# Patient Record
Sex: Female | Born: 1995 | Race: White | Hispanic: No | Marital: Single | Smoking: Never smoker
Health system: Southern US, Community
[De-identification: ages and names within clinical notes are randomized; demographics above are authoritative.]

## PROBLEM LIST (undated history)

## (undated) DIAGNOSIS — R112 Nausea with vomiting, unspecified: Secondary | ICD-10-CM

## (undated) DIAGNOSIS — Z789 Other specified health status: Secondary | ICD-10-CM

## (undated) HISTORY — PX: TONSILLECTOMY: SUR1361

## (undated) HISTORY — PX: COSMETIC SURGERY: SHX468

## (undated) HISTORY — PX: WISDOM TOOTH EXTRACTION: SHX21

---

## 2015-08-23 ENCOUNTER — Other Ambulatory Visit: Payer: Self-pay | Admitting: Family Medicine

## 2015-08-23 DIAGNOSIS — R102 Pelvic and perineal pain: Secondary | ICD-10-CM

## 2015-08-31 ENCOUNTER — Ambulatory Visit
Admission: RE | Admit: 2015-08-31 | Discharge: 2015-08-31 | Disposition: A | Discharge status: Home / Self Care | Payer: PRIVATE HEALTH INSURANCE | Source: Ambulatory Visit | Attending: Family Medicine | Admitting: Family Medicine

## 2015-08-31 DIAGNOSIS — R102 Pelvic and perineal pain: Secondary | ICD-10-CM

## 2016-07-05 ENCOUNTER — Ambulatory Visit (INDEPENDENT_AMBULATORY_CARE_PROVIDER_SITE_OTHER): Payer: PRIVATE HEALTH INSURANCE | Admitting: Emergency Medicine

## 2016-07-05 VITALS — BP 108/68 | HR 82 | Temp 97.6°F | Resp 17 | Ht 67.0 in | Wt 135.0 lb

## 2016-07-05 DIAGNOSIS — Z3201 Encounter for pregnancy test, result positive: Secondary | ICD-10-CM

## 2016-07-05 DIAGNOSIS — R112 Nausea with vomiting, unspecified: Secondary | ICD-10-CM | POA: Diagnosis not present

## 2016-07-05 DIAGNOSIS — R197 Diarrhea, unspecified: Secondary | ICD-10-CM | POA: Diagnosis not present

## 2016-07-05 DIAGNOSIS — R1084 Generalized abdominal pain: Secondary | ICD-10-CM | POA: Diagnosis not present

## 2016-07-05 LAB — POC MICROSCOPIC URINALYSIS (UMFC): Mucus: ABSENT

## 2016-07-05 LAB — POCT URINALYSIS DIP (MANUAL ENTRY)
BILIRUBIN UA: NEGATIVE
BILIRUBIN UA: NEGATIVE
Glucose, UA: NEGATIVE
NITRITE UA: NEGATIVE
PH UA: 7.5
PROTEIN UA: NEGATIVE
RBC UA: NEGATIVE
Spec Grav, UA: 1.015
Urobilinogen, UA: 1

## 2016-07-05 LAB — POCT URINE PREGNANCY: Preg Test, Ur: POSITIVE — AB

## 2016-07-05 MED ORDER — ONDANSETRON 4 MG PO TBDP: 4.0000 mg | ORAL_TABLET | Freq: Three times a day (TID) | ORAL | 0 refills | Status: DC | PRN

## 2016-07-05 MED ORDER — DICYCLOMINE HCL 20 MG PO TABS: 20.0000 mg | ORAL_TABLET | Freq: Four times a day (QID) | ORAL | 1 refills | Status: DC

## 2016-07-05 NOTE — Patient Instructions (Addendum)
IF you received an x-ray today, you will receive an invoice from [facility]. Please contact [facility] at [phone] with questions or concerns regarding your invoice.   IF you received labwork today, you will receive an invoice from [facility]. Please contact [facility] at [phone] with questions or concerns regarding your invoice.   Our billing staff will not be able to assist you with questions regarding bills from these companies.  You will be contacted with the lab results as soon as they are available. The fastest way to get your results is to activate your My Chart account. Instructions are located on the last page of this paperwork. If you have not heard from us regarding the results in 2 weeks, please contact this office.      Abdominal Pain, Adult Many things can cause belly (abdominal) pain. Most times, belly pain is not dangerous. Many cases of belly pain can be watched and treated at home. Sometimes belly pain is serious, though. Your doctor will try to find the cause of your belly pain. Follow these instructions at home:  Take over-the-counter and prescription medicines only as told by your doctor. Do not take medicines that help you poop (laxatives) unless told to by your doctor.  Drink enough fluid to keep your pee (urine) clear or pale yellow.  Watch your belly pain for any changes.  Keep all follow-up visits as told by your doctor. This is important. Contact a doctor if:  Your belly pain changes or gets worse.  You are not hungry, or you lose weight without trying.  You are having trouble pooping (constipated) or have watery poop (diarrhea) for more than 2-3 days.  You have pain when you pee or poop.  Your belly pain wakes you up at night.  Your pain gets worse with meals, after eating, or with certain foods.  You are throwing up and cannot keep anything down.  You have a fever. Get help right away if:  Your pain does not go  away as soon as your doctor says it should.  You cannot stop throwing up.  Your pain is only in areas of your belly, such as the right side or the left lower part of the belly.  You have bloody or black poop, or poop that looks like tar.  You have very bad pain, cramping, or bloating in your belly.  You have signs of not having enough fluid or water in your body (dehydration), such as:  Dark pee, very little pee, or no pee.  Cracked lips.  Dry mouth.  Sunken eyes.  Sleepiness.  Weakness. This information is not intended to replace advice given to you by your health care provider. Make sure you discuss any questions you have with your health care provider. Document Released: 12/17/2007 Document Revised: 01/18/2016 Document Reviewed: 12/12/2015 Elsevier Interactive Patient Education  2017 Elsevier Inc.  Abdominal Pain During Pregnancy Belly (abdominal) pain is common during pregnancy. Most of the time, it is not a serious problem. Other times, it can be a sign that something is wrong with the pregnancy. Always tell your doctor if you have belly pain. Follow these instructions at home: Monitor your belly pain for any changes. The following actions may help you feel better:  Do not have sex (intercourse) or put anything in your vagina until you feel better.  Rest until your pain stops.  Drink clear fluids if you feel sick to your stomach (nauseous). Do not eat solid food until  you feel better.  Only take medicine as told by your doctor.  Keep all doctor visits as told. Get help right away if:  You are bleeding, leaking fluid, or pieces of tissue come out of your vagina.  You have more pain or cramping.  You keep throwing up (vomiting).  You have pain when you pee (urinate) or have blood in your pee.  You have a fever.  You do not feel your baby moving as much.  You feel very weak or feel like passing out.  You have trouble breathing, with or without belly  pain.  You have a very bad headache and belly pain.  You have fluid leaking from your vagina and belly pain.  You keep having watery poop (diarrhea).  Your belly pain does not go away after resting, or the pain gets worse. This information is not intended to replace advice given to you by your health care provider. Make sure you discuss any questions you have with your health care provider. Document Released: 06/18/2009 Document Revised: 02/06/2016 Document Reviewed: 01/27/2013 Elsevier Interactive Patient Education  2017 [employer].

## 2016-07-05 NOTE — Progress Notes (Addendum)
Kathryn Swanson 20 y.o.   Chief Complaint  Patient presents with  . Abdominal Pain    Nausea, vomiting and diarrhea x1week. Pt states "comes in waves"    HISTORY OF PRESENT ILLNESS: This is a 20 y.o. female complaining of 2 week h/o intermittent lower abdominal cramping with diarrhea, developed n/v today, denies vaginal bleeding, on oral contraceptives but with erratic dosing schedule. Denies pregnancy. In no distress and having no pain now. Had normal period in November.  Abdominal Pain  This is a chronic problem. The current episode started 1 to 4 weeks ago. The onset quality is sudden. The problem occurs intermittently. Duration: several minutes. The problem has been waxing and waning. The pain is located in the generalized abdominal region (mostly lower). The pain is at a severity of 7/10. The patient is experiencing no pain (now). The quality of the pain is colicky, aching and sharp. The abdominal pain does not radiate. Associated symptoms include anorexia, constipation, diarrhea, nausea and vomiting (today). Pertinent negatives include no arthralgias, dysuria, frequency, headaches, hematochezia, hematuria, melena or myalgias. The pain is aggravated by eating. Relieved by: spontaneous relief. She has tried nothing for the symptoms. The treatment provided no relief. no medical Hx     Prior to Admission medications   Medication Sig Start Date End Date Taking? Authorizing Provider  drospirenone-ethinyl estradiol (YAZ,GIANVI,LORYNA) 3-0.02 MG tablet Take 1 tablet by mouth daily.   Yes Historical Provider, MD  sertraline (ZOLOFT) 25 MG tablet Take 25 mg by mouth daily.   Yes Historical Provider, MD    Allergies  Allergen Reactions  . Codeine     There are no active problems to display for this patient.   No past medical history on file.  No past surgical history on file.  Social History   Social History  . Marital status: Single    Spouse name: N/A  . Number of children: N/A   . Years of education: N/A   Occupational History  . Not on file.   Social History Main Topics  . Smoking status: Never Smoker  . Smokeless tobacco: Not on file  . Alcohol use Not on file  . Drug use: Unknown  . Sexual activity: Not on file   Other Topics Concern  . Not on file   Social History Narrative  . No narrative on file    No family history on file.   Review of Systems  Constitutional: Negative.  Negative for chills.  HENT: Negative.   Eyes: Negative.   Respiratory: Negative.  Negative for cough and shortness of breath.   Cardiovascular: Negative.  Negative for chest pain and palpitations.  Gastrointestinal: Positive for abdominal pain, anorexia, constipation, diarrhea, nausea and vomiting (today). Negative for hematochezia and melena.  Genitourinary: Negative.  Negative for dysuria, frequency and hematuria.  Musculoskeletal: Negative.  Negative for arthralgias, back pain and myalgias.  Skin: Negative.  Negative for rash.  Neurological: Negative.  Negative for headaches.  Endo/Heme/Allergies: Negative.   Psychiatric/Behavioral: Negative.      Physical Exam  Constitutional: She is oriented to person, place, and time. She appears well-developed and well-nourished.  HENT:  Head: Normocephalic and atraumatic.  Eyes: Conjunctivae and EOM are normal. Pupils are equal, round, and reactive to light.  Neck: Normal range of motion. Neck supple.  Cardiovascular: Normal rate, regular rhythm, normal heart sounds and intact distal pulses.   Pulmonary/Chest: Effort normal and breath sounds normal.  Abdominal: Soft. Bowel sounds are normal. She exhibits no mass. There is  no tenderness. There is no rebound.  Musculoskeletal: Normal range of motion.  Neurological: She is alert and oriented to person, place, and time.  Skin: Skin is warm and dry.  Psychiatric: She has a normal mood and affect.     ASSESSMENT & PLAN: Toni AmendCourtney was seen today for abdominal pain.  Diagnoses  and all orders for this visit:  Generalized abdominal pain -     Comprehensive metabolic panel -     CBC with Differential/Platelet -     POCT urinalysis dipstick -     POCT Microscopic Urinalysis ([facility]) -     POCT urine pregnancy -     Ambulatory referral to Gastroenterology -     Care order/instruction: -     Beta HCG, Quant  Non-intractable vomiting with nausea, unspecified vomiting type  Diarrhea, unspecified type  Positive pregnancy test  Other orders -     Discontinue: dicyclomine (BENTYL) 20 MG tablet; Take 1 tablet (20 mg total) by mouth every 6 (six) hours. As needed for abdominal pain. -     ondansetron (ZOFRAN ODT) 4 MG disintegrating tablet; Take 1 tablet (4 mg total) by mouth every 8 (eight) hours as needed for nausea or vomiting.    DDx d/w patient and mother in the room. Aware of possibilities including miscarriage/ectopic pregnancy. Advised to go to ER today or if worse; o/w needs to follow up with GYNMD.  07/06/16 1030am: Called pt's phone number on record but no answer. Called emergency contact number and spoke to patient's mother, Marcelino DusterMichelle, who was present in the room at all times yesterday and is very much aware of Delorese's medical condition/issues. She says patient is doing better today, however I made her aware that the blood pregnancy test confirmed the urinary result from yesterday and that an ectopic pregnancy is a possibility and needs to be ruled out. Advised to go to ER today and have it ruled out. She understands the nature of the problem and will follow the advise as recommended.  07/12/2016 1630: [facility]ried [employer] but no answer. Spoke to mother, Marcelino DusterMichelle. Pt doing well; went to the Hospital and ectopic was ruled out; 7.5 week IUP. Doing well.  Edwina BarthMiguel Johnaton Sonneborn, MD [facility]

## 2016-07-06 ENCOUNTER — Inpatient Hospital Stay
Admit: 2016-07-06 | Discharge: 2016-07-06 | Disposition: A | Discharge status: Home / Self Care | Payer: PRIVATE HEALTH INSURANCE | Attending: Emergency Medicine

## 2016-07-06 ENCOUNTER — Emergency Department: Admit: 2016-07-06 | Payer: PRIVATE HEALTH INSURANCE | Primary: Family Medicine

## 2016-07-06 ENCOUNTER — Emergency Department

## 2016-07-06 DIAGNOSIS — O26891 Other specified pregnancy related conditions, first trimester: Secondary | ICD-10-CM

## 2016-07-06 LAB — CBC WITH DIFFERENTIAL/PLATELET
BASOS ABS: 0 10*3/uL (ref 0.0–0.2)
Basos: 0 %
EOS (ABSOLUTE): 0.1 10*3/uL (ref 0.0–0.4)
Eos: 1 %
Hematocrit: 37.8 % (ref 34.0–46.6)
Hemoglobin: 12.9 g/dL (ref 11.1–15.9)
IMMATURE GRANS (ABS): 0 10*3/uL (ref 0.0–0.1)
IMMATURE GRANULOCYTES: 0 %
LYMPHS: 14 %
Lymphocytes Absolute: 1.3 10*3/uL (ref 0.7–3.1)
MCH: 30.1 pg (ref 26.6–33.0)
MCHC: 34.1 g/dL (ref 31.5–35.7)
MCV: 88 fL (ref 79–97)
Monocytes Absolute: 0.5 10*3/uL (ref 0.1–0.9)
Monocytes: 6 %
NEUTROS PCT: 79 %
Neutrophils Absolute: 7.1 10*3/uL — ABNORMAL HIGH (ref 1.4–7.0)
PLATELETS: 230 10*3/uL (ref 150–379)
RBC: 4.28 x10E6/uL (ref 3.77–5.28)
RDW: 13.1 % (ref 12.3–15.4)
WBC: 9 10*3/uL (ref 3.4–10.8)

## 2016-07-06 LAB — COMPREHENSIVE METABOLIC PANEL
A/G RATIO: 1.8 (ref 1.2–2.2)
ALT: 26 IU/L (ref 0–32)
AST: 36 IU/L (ref 0–40)
Albumin: 4 g/dL (ref 3.5–5.5)
Alkaline Phosphatase: 46 IU/L (ref 39–117)
BUN/Creatinine Ratio: 13 (ref 9–23)
BUN: 8 mg/dL (ref 6–20)
Bilirubin Total: 0.3 mg/dL (ref 0.0–1.2)
CALCIUM: 9.2 mg/dL (ref 8.7–10.2)
CO2: 22 mmol/L (ref 18–29)
Chloride: 101 mmol/L (ref 96–106)
Creatinine, Ser: 0.61 mg/dL (ref 0.57–1.00)
GFR calc Af Amer: 151 mL/min/{1.73_m2} (ref >59)
GFR, EST NON AFRICAN AMERICAN: 131 mL/min/{1.73_m2} (ref >59)
Globulin, Total: 2.2 g/dL (ref 1.5–4.5)
Glucose: 80 mg/dL (ref 65–99)
POTASSIUM: 4.2 mmol/L (ref 3.5–5.2)
Sodium: 139 mmol/L (ref 134–144)
Total Protein: 6.2 g/dL (ref 6.0–8.5)

## 2016-07-06 LAB — BETA HCG, QT
Beta HCG, QT: 59213 m[IU]/mL — ABNORMAL HIGH (ref 0–6)
hCG Quant: 59213 m[IU]/mL — ABNORMAL HIGH (ref 0–6)

## 2016-07-06 LAB — CBC WITH AUTOMATED DIFF
ABS. BASOPHILS: 0 10*3/uL (ref 0.0–0.1)
ABS. EOSINOPHILS: 0.1 10*3/uL (ref 0.0–0.4)
ABS. LYMPHOCYTES: 2.1 10*3/uL (ref 0.8–3.5)
ABS. MONOCYTES: 0.5 10*3/uL (ref 0.0–1.0)
ABS. NEUTROPHILS: 7 10*3/uL (ref 1.8–8.0)
BASOPHILS: 0 % (ref 0–1)
EOSINOPHILS: 1 % (ref 0–7)
HCT: 40.6 % (ref 35.0–47.0)
HGB: 13.7 g/dL (ref 11.5–16.0)
LYMPHOCYTES: 21 % (ref 12–49)
MCH: 29.9 PG (ref 26.0–34.0)
MCHC: 33.7 g/dL (ref 30.0–36.5)
MCV: 88.6 FL (ref 80.0–99.0)
MONOCYTES: 5 % (ref 5–13)
NEUTROPHILS: 73 % (ref 32–75)
PLATELET: 212 10*3/uL (ref 150–400)
RBC: 4.58 M/uL (ref 3.80–5.20)
RDW: 12.8 % (ref 11.5–14.5)
WBC: 9.7 10*3/uL (ref 3.6–11.0)

## 2016-07-06 LAB — METABOLIC PANEL, COMPREHENSIVE
A-G Ratio: 1.1 (ref 1.1–2.2)
ALT (SGPT): 31 U/L (ref 12–78)
AST (SGOT): 16 U/L (ref 15–37)
Albumin: 3.7 g/dL (ref 3.5–5.0)
Alk. phosphatase: 49 U/L (ref 45–117)
Anion gap: 9 mmol/L (ref 5–15)
BUN/Creatinine ratio: 12 (ref 12–20)
BUN: 8 MG/DL (ref 6–20)
Bilirubin, total: 0.3 MG/DL (ref 0.2–1.0)
CO2: 26 mmol/L (ref 21–32)
Calcium: 8.8 MG/DL (ref 8.5–10.1)
Chloride: 104 mmol/L (ref 97–108)
Creatinine: 0.68 MG/DL (ref 0.55–1.02)
GFR est AA: >60 mL/min/{1.73_m2} (ref >60)
GFR est non-AA: >60 mL/min/{1.73_m2} (ref >60)
Globulin: 3.4 g/dL (ref 2.0–4.0)
Glucose: 81 mg/dL (ref 65–100)
Potassium: 3.5 mmol/L (ref 3.5–5.1)
Protein, total: 7.1 g/dL (ref 6.4–8.2)
Sodium: 139 mmol/L (ref 136–145)

## 2016-07-06 LAB — URINALYSIS W/MICROSCOPIC
Bacteria: NEGATIVE /hpf
Bilirubin: NEGATIVE
Blood: NEGATIVE
Glucose: NEGATIVE mg/dL
Ketone: NEGATIVE mg/dL
Leukocyte Esterase: NEGATIVE
Nitrites: NEGATIVE
Protein: NEGATIVE mg/dL
Specific gravity: 1.021 (ref 1.003–1.030)
Urobilinogen: 0.2 EU/dL (ref 0.2–1.0)
pH (UA): 6.5 (ref 5.0–8.0)

## 2016-07-06 LAB — WET PREP
Clue cells: ABSENT
Wet prep: NONE SEEN

## 2016-07-06 LAB — KOH, OTHER SOURCES: KOH: NONE SEEN

## 2016-07-06 LAB — LIPASE: Lipase: 156 U/L (ref 73–393)

## 2016-07-06 MED ORDER — ONDANSETRON (PF) 4 MG/2 ML INJECTION
4 mg/2 mL | INTRAMUSCULAR | Status: AC
Start: 2016-07-06 — End: 2016-07-06
  Administered 2016-07-06: 22:00:00 via INTRAVENOUS

## 2016-07-06 MED ORDER — SODIUM CHLORIDE 0.9% BOLUS IV
0.9 % | Freq: Once | INTRAVENOUS | Status: AC
Start: 2016-07-06 — End: 2016-07-06
  Administered 2016-07-06: 22:00:00 via INTRAVENOUS

## 2016-07-06 MED ORDER — ONDANSETRON 4 MG TAB, RAPID DISSOLVE
4 mg | ORAL_TABLET | Freq: Three times a day (TID) | ORAL | 0 refills | Status: AC | PRN
Start: 2016-07-06 — End: ?

## 2016-07-06 MED FILL — ONDANSETRON (PF) 4 MG/2 ML INJECTION: 4 mg/2 mL | INTRAMUSCULAR | Qty: 2

## 2016-07-06 MED FILL — SODIUM CHLORIDE 0.9 % IV: INTRAVENOUS | Qty: 1000

## 2016-07-06 NOTE — ED Notes (Signed)
Pt to US via wheelchair.

## 2016-07-06 NOTE — ED Notes (Signed)
Pt discharged by provider.

## 2016-07-06 NOTE — ED Triage Notes (Signed)
Triage Note: Patient is coming in with abdominal pain with vomiting and diarrhea. Patient was seen at [facility] yesterday. Patient tested positive for pregnancy yesterday. Patient is having pain in waves and is concerned for ectrotic pregnancy.  Patient is from out of town.

## 2016-07-06 NOTE — ED Provider Notes (Signed)
HPI Comments: 20 y.o. female with no significant past medical history who presents from home via private vehicle with chief complaint of abdominal pain, nausea, vomiting, diarrhea.  Pt reports that her Sx started a few days ago and have persisted since onset without relief.  She was seen at an urgent care clinic yesterday for her Sx and during her work up had a positive pregnancy test.  She was D/C home after U/A negative for infection.  However, today her abdominal pain worsened and has been intermittent cramping pains which concerned and her prompted her to come to the ED for further evaluation.  Pt denies fever, chills, vaginal bleeding or discharge.  There are no other acute medical concerns at this time.    Note written by Rondall AllegraMallory Abney, scribe, as dictated by Pierre BaliAlexis A DiMaio, MD 6:05 PM        The history is provided by the patient.        History reviewed. No pertinent past medical history.    Past Surgical History:   Procedure Laterality Date   ??? HX HEENT      Tonsillectomy   ??? HX OTHER SURGICAL      Cosmetic Surgery         History reviewed. No pertinent family history.    Social History     Social History   ??? Marital status: SINGLE     Spouse name: N/A   ??? Number of children: N/A   ??? Years of education: N/A     Occupational History   ??? Not on file.     Social History Main Topics   ??? Smoking status: Never Smoker   ??? Smokeless tobacco: Never Used   ??? Alcohol use No   ??? Drug use: No   ??? Sexual activity: Not on file     Other Topics Concern   ??? Not on file     Social History Narrative   ??? No narrative on file         ALLERGIES: Codeine    Review of Systems   Constitutional: Negative for chills and fever.   HENT: Negative for congestion, rhinorrhea, sneezing and sore throat.    Eyes: Negative for redness and visual disturbance.   Respiratory: Negative for shortness of breath.    Cardiovascular: Negative for chest pain and leg swelling.   Gastrointestinal: Positive for abdominal pain, diarrhea, nausea and  vomiting.   Genitourinary: Negative for difficulty urinating, dysuria, frequency, vaginal bleeding and vaginal discharge.   Musculoskeletal: Negative for back pain, myalgias and neck stiffness.   Skin: Negative for rash.   Neurological: Negative for dizziness, syncope, weakness and headaches.   Hematological: Negative for adenopathy.   All other systems reviewed and are negative.      Vitals:    07/06/16 1457   BP: 114/77   Pulse: 85   Resp: 14   Temp: 98.2 ??F (36.8 ??C)   SpO2: 99%   Weight: 61.5 kg (135 lb 8 oz)   Height: 5\' 7"  (1.702 m)            Physical Exam   Constitutional: She is oriented to person, place, and time. She appears well-developed and well-nourished.   HENT:   Head: Normocephalic and atraumatic.   Eyes: Conjunctivae are normal. Pupils are equal, round, and reactive to light. Right eye exhibits no discharge. Left eye exhibits no discharge.   Neck: Normal range of motion. Neck supple. No thyromegaly present.   Cardiovascular: Normal rate,  regular rhythm and normal heart sounds.  Exam reveals no gallop and no friction rub.    No murmur heard.  Pulmonary/Chest: Effort normal and breath sounds normal. No respiratory distress. She has no wheezes.   Abdominal: Soft. Bowel sounds are normal. She exhibits no distension. There is no tenderness. There is no rebound and no guarding.   Musculoskeletal: Normal range of motion.   Neurological: She is alert and oriented to person, place, and time.   Skin: Skin is warm.   Psychiatric: She has a normal mood and affect.        MDM  Number of Diagnoses or Management Options  Nausea and vomiting, intractability of vomiting not specified, unspecified vomiting type:   Diagnosis management comments: US revealed viable intrauterine pregnancy.  Pt is afebrile and non toxic appearing.  Will treat nausea with zofran and pt was advised to follow up with her family doctor for further evaluation of symptoms.  Reviewed treatment plan with attending and they agree.   Eliezer Champagneavid B Marquist Binstock, PA-C    ED Course       Procedures

## 2016-07-07 LAB — BLOOD TYPE, (ABO+RH)
ABO/Rh(D): A NEG
ABO/Rh: A NEG
WEAK D: NEGATIVE
Weak D: NEGATIVE

## 2016-07-08 LAB — CHLAMYDIA/GC PCR
Chlamydia amplified: NEGATIVE
N. gonorrhea, amplified: NEGATIVE

## 2016-07-09 LAB — BETA HCG QUANT (REF LAB): HCG QUANT: 52883 m[IU]/mL

## 2016-07-09 LAB — SPECIMEN STATUS REPORT

## 2016-07-12 ENCOUNTER — Telehealth: Payer: Self-pay | Admitting: Emergency Medicine

## 2016-07-14 NOTE — L&D Delivery Note (Addendum)
Delivery Note At 5:54 AM a viable female was delivered via Vaginal, Spontaneous Delivery (Presentation: vtx; ROA ).  APGAR: 6, 9; weight pending.   Placenta status: spontaneous, intact.  Cord:  with the following complications: nuchal x 2 reduced.   Anesthesia:  Epidural, local Episiotomy: Median-done due to maternal exhaustion Lacerations:  None Suture Repair: 3.0 vicryl rapide Est. Blood Loss (mL):  200  Mom to postpartum.  Baby to Couplet care / Skin to Skin.  Will do circumcision in the hospital.  She was started on Unasyn at 0200 for temp to 101.3, will give one more dose then d/c.  Leighton Roach Natania Finigan 02/28/2017, 6:17 AM

## 2016-07-29 LAB — OB RESULTS CONSOLE GC/CHLAMYDIA
Chlamydia: NEGATIVE
Gonorrhea: NEGATIVE

## 2016-07-29 LAB — OB RESULTS CONSOLE HEPATITIS B SURFACE ANTIGEN: HEP B S AG: NEGATIVE

## 2016-07-29 LAB — OB RESULTS CONSOLE RUBELLA ANTIBODY, IGM: Rubella: IMMUNE

## 2016-07-29 LAB — OB RESULTS CONSOLE ABO/RH: RH Type: NEGATIVE

## 2016-07-29 LAB — OB RESULTS CONSOLE HIV ANTIBODY (ROUTINE TESTING): HIV: NONREACTIVE

## 2016-07-29 LAB — OB RESULTS CONSOLE ANTIBODY SCREEN: Antibody Screen: NEGATIVE

## 2016-07-29 LAB — OB RESULTS CONSOLE RPR: RPR: NONREACTIVE

## 2016-10-24 ENCOUNTER — Ambulatory Visit: Payer: PRIVATE HEALTH INSURANCE | Admitting: Podiatry

## 2016-12-05 NOTE — Telephone Encounter (Signed)
Spoke to mother about results and need to get ultrasound to r/o ectopic; pt had u/s done and showed live IUP; she's doing better.

## 2017-01-30 LAB — OB RESULTS CONSOLE GBS: STREP GROUP B AG: POSITIVE

## 2017-02-13 ENCOUNTER — Telehealth (HOSPITAL_COMMUNITY): Payer: Self-pay | Admitting: *Deleted

## 2017-02-13 ENCOUNTER — Encounter (HOSPITAL_COMMUNITY): Payer: Self-pay | Admitting: *Deleted

## 2017-02-13 NOTE — Telephone Encounter (Signed)
Preadmission screen  

## 2017-02-21 ENCOUNTER — Inpatient Hospital Stay (HOSPITAL_COMMUNITY)
Admission: AD | Admit: 2017-02-21 | Payer: PRIVATE HEALTH INSURANCE | Source: Ambulatory Visit | Admitting: Obstetrics and Gynecology

## 2017-02-26 NOTE — H&P (Signed)
Kathryn Swanson is a 21 y.o. female, G1P0, EGA 40+ weeks with EDC 8-11 presenting for cervical ripening and induction.  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  OB History    Gravida Para Term Preterm AB Living   1             SAB TAB Ectopic Multiple Live Births                 No past medical history on file. No past surgical history on file. Family History: family history is not on file. Social History:  reports that she has never smoked. She does not have any smokeless tobacco history on file. Her alcohol and drug histories are not on file.     Maternal Diabetes: No Genetic Screening: Normal Maternal Ultrasounds/Referrals: Abnormal:  Findings:   Isolated choroid plexus cyst, Other: Fetal Ultrasounds or other Referrals:  None Maternal Substance Abuse:  No Significant Maternal Medications:  None Significant Maternal Lab Results:  Lab values include: Group B Strep positive Other Comments:  2 vessel cord  Review of Systems  Respiratory: Negative.   Cardiovascular: Negative.    Maternal Medical History:  Fetal activity: Perceived fetal activity is normal.    Prenatal complications: no prenatal complications     Last menstrual period 05/17/2016. Maternal Exam:  Abdomen: Patient reports no abdominal tenderness. Estimated fetal weight is 7 lbs.   Fetal presentation: vertex  Introitus: Normal vulva. Normal vagina.  Amniotic fluid character: not assessed.  Pelvis: adequate for delivery.      Physical Exam  Constitutional: She appears well-developed and well-nourished.  Cardiovascular: Normal rate and regular rhythm.   Respiratory: Effort normal. No respiratory distress.  GI: Soft.    Prenatal labs: ABO, Rh: A/Negative/-- (01/16 0000) Antibody: Negative (01/16 0000) Rubella: Immune (01/16 0000) RPR: Nonreactive (01/16 0000)  HBsAg: Negative (01/16 0000)  HIV: Non-reactive (01/16  0000)  GBS: Positive (07/20 0000)   Assessment/Plan: IUP at 40+ weeks with unfavorable cervix, 2 VC and GBS +.  Will admit for cervical ripening with cytotec, PCN for GBS to start on admission also.     Leighton Roachodd D Denis Koppel 02/26/2017, 8:15 PM

## 2017-02-27 ENCOUNTER — Inpatient Hospital Stay (HOSPITAL_COMMUNITY): Payer: PRIVATE HEALTH INSURANCE | Admitting: Anesthesiology

## 2017-02-27 ENCOUNTER — Encounter (HOSPITAL_COMMUNITY): Payer: Self-pay

## 2017-02-27 ENCOUNTER — Inpatient Hospital Stay (HOSPITAL_COMMUNITY)
Admission: RE | Admit: 2017-02-27 | Discharge: 2017-03-02 | DRG: 775 | Disposition: A | Discharge status: Home / Self Care | Payer: PRIVATE HEALTH INSURANCE | Source: Ambulatory Visit | Attending: Obstetrics and Gynecology | Admitting: Obstetrics and Gynecology

## 2017-02-27 DIAGNOSIS — O99824 Streptococcus B carrier state complicating childbirth: Secondary | ICD-10-CM | POA: Diagnosis present

## 2017-02-27 DIAGNOSIS — Z3A40 40 weeks gestation of pregnancy: Secondary | ICD-10-CM

## 2017-02-27 DIAGNOSIS — O358XX0 Maternal care for other (suspected) fetal abnormality and damage, not applicable or unspecified: Secondary | ICD-10-CM | POA: Diagnosis present

## 2017-02-27 DIAGNOSIS — O26893 Other specified pregnancy related conditions, third trimester: Secondary | ICD-10-CM | POA: Diagnosis present

## 2017-02-27 HISTORY — DX: Other specified health status: Z78.9

## 2017-02-27 LAB — TYPE AND SCREEN
ABO/RH(D): A NEG
Antibody Screen: NEGATIVE

## 2017-02-27 LAB — CBC
HEMATOCRIT: 31.9 % — AB (ref 36.0–46.0)
Hemoglobin: 10.7 g/dL — ABNORMAL LOW (ref 12.0–15.0)
MCH: 26.4 pg (ref 26.0–34.0)
MCHC: 33.5 g/dL (ref 30.0–36.0)
MCV: 78.8 fL (ref 78.0–100.0)
Platelets: 203 10*3/uL (ref 150–400)
RBC: 4.05 MIL/uL (ref 3.87–5.11)
RDW: 15.3 % (ref 11.5–15.5)
WBC: 9.9 10*3/uL (ref 4.0–10.5)

## 2017-02-27 LAB — ABO/RH: ABO/RH(D): A NEG

## 2017-02-27 LAB — RPR: RPR Ser Ql: NONREACTIVE

## 2017-02-27 MED ORDER — EPHEDRINE 5 MG/ML INJ
10.0000 mg | INTRAVENOUS | Status: DC | PRN
  Filled 2017-02-27: qty 2

## 2017-02-27 MED ORDER — LIDOCAINE HCL (PF) 1 % IJ SOLN
30.0000 mL | INTRAMUSCULAR | Status: DC | PRN
  Administered 2017-02-28: 30 mL via SUBCUTANEOUS
  Filled 2017-02-27: qty 30

## 2017-02-27 MED ORDER — DIPHENHYDRAMINE HCL 50 MG/ML IJ SOLN: 12.5000 mg | INTRAMUSCULAR | Status: DC | PRN

## 2017-02-27 MED ORDER — PHENYLEPHRINE 40 MCG/ML (10ML) SYRINGE FOR IV PUSH (FOR BLOOD PRESSURE SUPPORT)
80.0000 ug | PREFILLED_SYRINGE | INTRAVENOUS | Status: DC | PRN
  Filled 2017-02-27: qty 5

## 2017-02-27 MED ORDER — LACTATED RINGERS IV SOLN: 500.0000 mL | Freq: Once | INTRAVENOUS | Status: DC

## 2017-02-27 MED ORDER — SOD CITRATE-CITRIC ACID 500-334 MG/5ML PO SOLN: 30.0000 mL | ORAL | Status: DC | PRN

## 2017-02-27 MED ORDER — OXYTOCIN BOLUS FROM INFUSION
500.0000 mL | Freq: Once | INTRAVENOUS | Status: AC
  Administered 2017-02-28: 500 mL via INTRAVENOUS

## 2017-02-27 MED ORDER — ACETAMINOPHEN 325 MG PO TABS: 650.0000 mg | ORAL_TABLET | ORAL | Status: DC | PRN

## 2017-02-27 MED ORDER — OXYCODONE-ACETAMINOPHEN 5-325 MG PO TABS
1.0000 | ORAL_TABLET | ORAL | Status: DC | PRN
Start: 2017-02-27 — End: 2017-02-28

## 2017-02-27 MED ORDER — MISOPROSTOL 25 MCG QUARTER TABLET
25.0000 ug | ORAL_TABLET | ORAL | Status: DC | PRN
  Administered 2017-02-27: 25 ug via VAGINAL
  Filled 2017-02-27 (×2): qty 1

## 2017-02-27 MED ORDER — PENICILLIN G POT IN DEXTROSE 60000 UNIT/ML IV SOLN
3.0000 10*6.[IU] | INTRAVENOUS | Status: DC
  Administered 2017-02-27 – 2017-02-28 (×6): 3 10*6.[IU] via INTRAVENOUS
  Filled 2017-02-27 (×8): qty 50

## 2017-02-27 MED ORDER — FENTANYL 2.5 MCG/ML BUPIVACAINE 1/10 % EPIDURAL INFUSION (WH - ANES)
14.0000 mL/h | INTRAMUSCULAR | Status: DC | PRN
  Administered 2017-02-27 – 2017-02-28 (×5): 14 mL/h via EPIDURAL
  Filled 2017-02-27 (×4): qty 100

## 2017-02-27 MED ORDER — OXYTOCIN 40 UNITS IN LACTATED RINGERS INFUSION - SIMPLE MED: 2.5000 [IU]/h | INTRAVENOUS | Status: DC

## 2017-02-27 MED ORDER — OXYCODONE-ACETAMINOPHEN 5-325 MG PO TABS: 2.0000 | ORAL_TABLET | ORAL | Status: DC | PRN

## 2017-02-27 MED ORDER — LIDOCAINE HCL (PF) 1 % IJ SOLN
INTRAMUSCULAR | Status: DC | PRN
  Administered 2017-02-27 (×2): 6 mL via EPIDURAL

## 2017-02-27 MED ORDER — TERBUTALINE SULFATE 1 MG/ML IJ SOLN
0.2500 mg | Freq: Once | INTRAMUSCULAR | Status: DC | PRN
  Filled 2017-02-27: qty 1

## 2017-02-27 MED ORDER — PHENYLEPHRINE 40 MCG/ML (10ML) SYRINGE FOR IV PUSH (FOR BLOOD PRESSURE SUPPORT)
80.0000 ug | PREFILLED_SYRINGE | INTRAVENOUS | Status: DC | PRN
  Filled 2017-02-27: qty 10
  Filled 2017-02-27: qty 5

## 2017-02-27 MED ORDER — PENICILLIN G POTASSIUM 5000000 UNITS IJ SOLR
5.0000 10*6.[IU] | Freq: Once | INTRAVENOUS | Status: AC
  Administered 2017-02-27: 5 10*6.[IU] via INTRAVENOUS
  Filled 2017-02-27: qty 5

## 2017-02-27 MED ORDER — LACTATED RINGERS IV SOLN: 500.0000 mL | INTRAVENOUS | Status: DC | PRN

## 2017-02-27 MED ORDER — ONDANSETRON HCL 4 MG/2ML IJ SOLN
4.0000 mg | Freq: Four times a day (QID) | INTRAMUSCULAR | Status: DC | PRN
Start: 2017-02-27 — End: 2017-02-28
  Administered 2017-02-27: 4 mg via INTRAVENOUS
  Filled 2017-02-27: qty 2

## 2017-02-27 MED ORDER — LACTATED RINGERS IV SOLN
500.0000 mL | Freq: Once | INTRAVENOUS | Status: AC
  Administered 2017-02-27: 500 mL via INTRAVENOUS

## 2017-02-27 MED ORDER — OXYTOCIN 40 UNITS IN LACTATED RINGERS INFUSION - SIMPLE MED
1.0000 m[IU]/min | INTRAVENOUS | Status: DC
  Administered 2017-02-27: 2 m[IU]/min via INTRAVENOUS
  Administered 2017-02-28: 1 m[IU]/min via INTRAVENOUS
  Filled 2017-02-27 (×2): qty 1000

## 2017-02-27 MED ORDER — LACTATED RINGERS IV SOLN
INTRAVENOUS | Status: DC
  Administered 2017-02-27 – 2017-02-28 (×3): via INTRAVENOUS

## 2017-02-27 MED ORDER — BUTORPHANOL TARTRATE 1 MG/ML IJ SOLN: 1.0000 mg | INTRAMUSCULAR | Status: DC | PRN

## 2017-02-27 NOTE — Anesthesia Preprocedure Evaluation (Addendum)
Anesthesia Evaluation  Patient identified by MRN, date of birth, ID band Patient awake    Reviewed: Allergy & Precautions, NPO status , Patient's Chart, lab work & pertinent test results  History of Anesthesia Complications Negative for: history of anesthetic complications  Airway Mallampati: II  TM Distance: >3 FB Neck ROM: Full    Dental  (+) Dental Advisory Given   Pulmonary neg pulmonary ROS,    breath sounds clear to auscultation       Cardiovascular negative cardio ROS   Rhythm:Regular Rate:Normal     Neuro/Psych negative neurological ROS     GI/Hepatic negative GI ROS, Neg liver ROS,   Endo/Other  negative endocrine ROS  Renal/GU negative Renal ROS     Musculoskeletal   Abdominal   Peds  Hematology plt 203k   Anesthesia Other Findings   Reproductive/Obstetrics (+) Pregnancy                            Anesthesia Physical Anesthesia Plan  ASA: II  Anesthesia Plan: Epidural   Post-op Pain Management:    Induction:   PONV Risk Score and Plan: Treatment may vary due to age or medical condition  Airway Management Planned: Natural Airway  Additional Equipment:   Intra-op Plan:   Post-operative Plan:   Informed Consent: I have reviewed the patients History and Physical, chart, labs and discussed the procedure including the risks, benefits and alternatives for the proposed anesthesia with the patient or authorized representative who has indicated his/her understanding and acceptance.   Dental advisory given  Plan Discussed with:   Anesthesia Plan Comments: (Patient identified. Risks/Benefits/Options discussed with patient including but not limited to bleeding, infection, nerve damage, paralysis, failed block, incomplete pain control, headache, blood pressure changes, nausea, vomiting, reactions to medication both or allergic, itching and postpartum back pain. Confirmed with  bedside nurse the patient's most recent platelet count. Confirmed with patient that they are not currently taking any anticoagulation, have any bleeding history or any family history of bleeding disorders. Patient expressed understanding and wished to proceed. All questions were answered. )       Anesthesia Quick Evaluation

## 2017-02-27 NOTE — Anesthesia Pain Management Evaluation Note (Signed)
  [facility] Pain Management Visit Note  Patient: Kathryn Swanson, 22 y.o., female  "Hello I am a member of the anesthesia team at [facility]. We have an anesthesia team available at all times to provide care throughout the hospital, including epidural management and anesthesia for C-section. I don't know your plan for the delivery whether it a natural birth, water birth, IV sedation, nitrous supplementation, doula or epidural, but we want to meet your pain goals."   1.Was your pain managed to your expectations on prior hospitalizations?   No prior hospitalizations  2.What is your expectation for pain management during this hospitalization?     Epidural  3.How can we help you reach that goal? epidural  Record the patient's initial score and the patient's pain goal.   Pain: 0  Pain Goal: 5 [facility] wants you to be able to say your pain was always managed very well.  Kathryn Swanson 02/27/2017

## 2017-02-27 NOTE — Progress Notes (Signed)
Patient ID: Kathryn Swanson, female   DOB: [birth year], 21 y.o.   MRN: [id number] Received one dose of cytotec overnight, now on pitocin Feeling ctx Afeb, VSS FHT- Cat I, ctx q 2 min on 4 mu/min pitocin VE-FT per RN Continue pitocin and PCN, monitor progress, plan AROM when more dilated

## 2017-02-27 NOTE — Progress Notes (Signed)
Patient ID: Kathryn Swanson, female   DOB: 1996/04/21, 21 y.o.   MRN: [id number] Feeling ctx, waiting for epidural Afeb, VSS FHT- Cat II, 110-120s, rare variable decel, some early decels, ctx q 2 min VE-1/80/-2 per RN Continue pitocin and PCN, to get epidural, monitor progress

## 2017-02-27 NOTE — Progress Notes (Signed)
Patient ID: Kathryn Swanson, female   DOB: January 05, 1996, 21 y.o.   MRN: [id number] Comfortable with epidural, may have leaked some fluid off and on Afeb, VSS FHT-Cat II, 120s, mod variability, had some late decels with tachysystole which almost completely resolved with position change and reducing pitocin, some early and variable decels also, ctx q 2-4 min on 4 mu/min pitocin VE-2/90/-2, vtx, AROM clear Continue pitocin and pitocin, monitor progress, monitor FHT

## 2017-02-27 NOTE — Anesthesia Procedure Notes (Signed)
Epidural Patient location during procedure: OB Start time: 02/27/2017 12:35 PM End time: 02/27/2017 12:56 PM  Staffing Anesthesiologist: Jairo Ben Performed: anesthesiologist   Preanesthetic Checklist Completed: patient identified, surgical consent, pre-op evaluation, timeout performed, IV checked, risks and benefits discussed and monitors and equipment checked  Epidural Patient position: sitting Prep: site prepped and draped and DuraPrep Patient monitoring: blood pressure, continuous pulse ox and heart rate Approach: midline Location: L3-L4 Injection technique: LOR air  Needle:  Needle type: Tuohy  Needle gauge: 17 G Needle length: 9 cm Needle insertion depth: 4.5 cm Catheter type: closed end flexible Catheter size: 19 Gauge Catheter at skin depth: 10 cm Test dose: negative (1% lidocaine)  Assessment Events: blood not aspirated, injection not painful, no injection resistance, negative IV test and no paresthesia  Additional Notes Pt identified in Labor room.  Monitors applied. Working IV access confirmed. Sterile prep, drape lumbar spine.  1% lido local L 3,4.  #17ga Touhy LOR air at 4.5 cm L 3,4, cath in easily to 10 cm skin. Test dose OK, cath dosed and infusion begun.  Patient asymptomatic, VSS, no heme aspirated, tolerated well.  Sandford Craze, MDReason for block:procedure for pain

## 2017-02-28 ENCOUNTER — Encounter (HOSPITAL_COMMUNITY): Payer: Self-pay

## 2017-02-28 MED ORDER — ONDANSETRON HCL 4 MG PO TABS: 4.0000 mg | ORAL_TABLET | ORAL | Status: DC | PRN

## 2017-02-28 MED ORDER — IBUPROFEN 600 MG PO TABS
600.0000 mg | ORAL_TABLET | Freq: Four times a day (QID) | ORAL | Status: DC
  Administered 2017-02-28 – 2017-03-02 (×10): 600 mg via ORAL
  Filled 2017-02-28 (×10): qty 1

## 2017-02-28 MED ORDER — SENNOSIDES-DOCUSATE SODIUM 8.6-50 MG PO TABS
2.0000 | ORAL_TABLET | ORAL | Status: DC
  Administered 2017-03-01 – 2017-03-02 (×2): 2 via ORAL
  Filled 2017-02-28 (×2): qty 2

## 2017-02-28 MED ORDER — MEASLES, MUMPS & RUBELLA VAC ~~LOC~~ INJ
0.5000 mL | INJECTION | Freq: Once | SUBCUTANEOUS | Status: DC
  Filled 2017-02-28: qty 0.5

## 2017-02-28 MED ORDER — ZOLPIDEM TARTRATE 5 MG PO TABS: 5.0000 mg | ORAL_TABLET | Freq: Every evening | ORAL | Status: DC | PRN

## 2017-02-28 MED ORDER — DIPHENHYDRAMINE HCL 25 MG PO CAPS: 25.0000 mg | ORAL_CAPSULE | Freq: Four times a day (QID) | ORAL | Status: DC | PRN

## 2017-02-28 MED ORDER — OXYCODONE HCL 5 MG PO TABS: 5.0000 mg | ORAL_TABLET | ORAL | Status: DC | PRN

## 2017-02-28 MED ORDER — ACETAMINOPHEN 500 MG PO TABS
1000.0000 mg | ORAL_TABLET | Freq: Once | ORAL | Status: AC
  Administered 2017-02-28: 1000 mg via ORAL
  Filled 2017-02-28: qty 2

## 2017-02-28 MED ORDER — PRENATAL MULTIVITAMIN CH
1.0000 | ORAL_TABLET | Freq: Every day | ORAL | Status: DC
  Administered 2017-02-28 – 2017-03-02 (×3): 1 via ORAL
  Filled 2017-02-28 (×3): qty 1

## 2017-02-28 MED ORDER — COCONUT OIL OIL
1.0000 "application " | TOPICAL_OIL | Status: DC | PRN
  Administered 2017-03-02: 1 via TOPICAL
  Filled 2017-02-28: qty 120

## 2017-02-28 MED ORDER — DIBUCAINE 1 % RE OINT
1.0000 "application " | TOPICAL_OINTMENT | RECTAL | Status: DC | PRN
  Filled 2017-02-28: qty 28

## 2017-02-28 MED ORDER — SIMETHICONE 80 MG PO CHEW: 80.0000 mg | CHEWABLE_TABLET | ORAL | Status: DC | PRN

## 2017-02-28 MED ORDER — METHYLERGONOVINE MALEATE 0.2 MG/ML IJ SOLN: 0.2000 mg | INTRAMUSCULAR | Status: DC | PRN

## 2017-02-28 MED ORDER — OXYCODONE HCL 5 MG PO TABS: 10.0000 mg | ORAL_TABLET | ORAL | Status: DC | PRN

## 2017-02-28 MED ORDER — ACETAMINOPHEN 325 MG PO TABS
650.0000 mg | ORAL_TABLET | ORAL | Status: DC | PRN
  Administered 2017-03-01 – 2017-03-02 (×3): 650 mg via ORAL
  Filled 2017-02-28 (×3): qty 2

## 2017-02-28 MED ORDER — METHYLERGONOVINE MALEATE 0.2 MG PO TABS: 0.2000 mg | ORAL_TABLET | ORAL | Status: DC | PRN

## 2017-02-28 MED ORDER — BENZOCAINE-MENTHOL 20-0.5 % EX AERO
1.0000 "application " | INHALATION_SPRAY | CUTANEOUS | Status: DC | PRN
  Administered 2017-02-28: 1 via TOPICAL
  Filled 2017-02-28: qty 56

## 2017-02-28 MED ORDER — TETANUS-DIPHTH-ACELL PERTUSSIS 5-2.5-18.5 LF-MCG/0.5 IM SUSP: 0.5000 mL | Freq: Once | INTRAMUSCULAR | Status: DC

## 2017-02-28 MED ORDER — AMPICILLIN-SULBACTAM SODIUM 3 (2-1) G IJ SOLR
3.0000 g | Freq: Four times a day (QID) | INTRAMUSCULAR | Status: DC
  Administered 2017-02-28 (×2): 3 g via INTRAVENOUS
  Filled 2017-02-28 (×3): qty 3

## 2017-02-28 MED ORDER — WITCH HAZEL-GLYCERIN EX PADS: 1.0000 "application " | MEDICATED_PAD | CUTANEOUS | Status: DC | PRN

## 2017-02-28 MED ORDER — MAGNESIUM HYDROXIDE 400 MG/5ML PO SUSP: 30.0000 mL | ORAL | Status: DC | PRN

## 2017-02-28 MED ORDER — ONDANSETRON HCL 4 MG/2ML IJ SOLN: 4.0000 mg | INTRAMUSCULAR | Status: DC | PRN

## 2017-02-28 NOTE — Anesthesia Postprocedure Evaluation (Signed)
Anesthesia Post Note  Patient: Kathryn Swanson  Procedure(s) Performed: * No procedures listed *     Patient location during evaluation: Mother Baby Anesthesia Type: Epidural Level of consciousness: awake Pain management: pain level controlled Vital Signs Assessment: post-procedure vital signs reviewed and stable Respiratory status: spontaneous breathing Cardiovascular status: stable Postop Assessment: no headache, no backache, epidural receding, patient able to bend at knees, no signs of nausea or vomiting and adequate PO intake Anesthetic complications: no    Last Vitals:  Vitals:   02/28/17 0820 02/28/17 0945  BP: 129/70 (!) 116/55  Pulse: (!) 101 87  Resp: 18 20  Temp: 37 C 37.1 C  SpO2:      Last Pain:  Vitals:   02/28/17 0945  TempSrc:   PainSc: 0-No pain   Pain Goal:                 Khylen Riolo

## 2017-02-28 NOTE — Lactation Note (Signed)
This note was copied from a baby's chart. Lactation Consultation Note  Patient Name: Kathryn SwansonH Date: 02/28/2017 Reason for consult: Initial assessment  Baby 6 hours old. Mom reports baby nursed well earlier. Mom declines any assistance at this time stating that she wants to rest. Mom given [facility] brochure, aware of OP/BFSG and LC phone line assistance after D/C.   Maternal Data Has patient been taught Hand Expression?: Yes (Per mom.) Does the patient have breastfeeding experience prior to this delivery?: No  Feeding    LATCH Score                   Interventions    Lactation Tools Discussed/Used     Consult Status Consult Status: Follow-up Date: 03/01/17 Follow-up type: In-patient    Sherlyn Hay 02/28/2017, 12:08 PM

## 2017-02-28 NOTE — Progress Notes (Signed)
CSW received consult for hx of Anxiety and Depression. CSW met with MOB to offer support and complete assessment.   Upon this writers arrival, CSW noted several positive supports for MOB. CSW inquired about anxiety hx. MOB confirmed her hx of anxiety; however, notes she is doing fine and feels ok after delivery. MOB denies the need for behavioral health follow-up.   CSW provided education regarding the baby blues period vs. perinatal mood disorders, discussed treatment and gave resources for mental health follow up if concerns arise. CSW recommends self-evaluation during the postpartum time period using the New Mom Checklist from Postpartum Progress and encouraged MOB to contact a medical professional if symptoms are noted at any time.  CSW provided review of Sudden Infant Death Syndrome (SIDS) precautions.  CSW identifies no further need for intervention and no barriers to discharge at this time.  Kathryn Swanson, MSW, LCSW-A Clinical Social Worker  [facility]  Office: [phone]

## 2017-03-01 NOTE — Lactation Note (Signed)
This note was copied from a baby's chart. Lactation Consultation Note  Patient Name: Kathryn Swanson [id number] Date: 03/01/2017 Reason for consult: Follow-up assessment  Mom has multiple visitors in room. Parents say that infant's feeding has improved. Mom declines latch assist, saying, "We're doing pretty good."  Lurline Hare [facility] 03/01/2017, 6:09 PM

## 2017-03-01 NOTE — Progress Notes (Signed)
Patient ID: Kathryn Swanson, female   DOB: 1995-09-13, 21 y.o.   MRN: [id number] PPD #1 No problems Afeb, VSS Fundus firm, NT at U-1 Continue routine postpartum care, circ on boy today-discussed procedure and risks

## 2017-03-01 NOTE — Plan of Care (Signed)
Problem: Nutritional: Goal: Mothers verbalization of comfort with breastfeeding process will improve Outcome: Progressing Mom has started to ask for assistance; baby latching better

## 2017-03-02 ENCOUNTER — Ambulatory Visit: Payer: Self-pay

## 2017-03-02 MED ORDER — IBUPROFEN 600 MG PO TABS: 600.0000 mg | ORAL_TABLET | Freq: Four times a day (QID) | ORAL | 1 refills | Status: AC | PRN

## 2017-03-02 NOTE — Progress Notes (Signed)
Patient ID: Kathryn Swanson, female   DOB: [birth year], 21 y.o.   MRN: [id number] Pt doing well with no complaints. Pain well controlled. Ambulating well. Voiding with no issues. Denies CP, SOB, fever or chills. Bonding well with baby- breastfeeding. Ready for discharge to home today VSS ABD - FF EXT - no Homans  A/P: PPD#2 s/p svd - stable         Discharge instructions reviewed         F/u in 6 weeks

## 2017-03-02 NOTE — Discharge Summary (Signed)
OB Discharge Summary     Patient Name: Kathryn Swanson DOB: [birth year] MRN: [id number]  Date of admission: 02/27/2017 Delivering MD: Jackelyn Knife, TODD   Date of discharge: 03/02/2017  Admitting diagnosis: INDUCTION Intrauterine pregnancy: [redacted]w[redacted]d     Secondary diagnosis:  Active Problems:   Indication for care in labor or delivery   SVD (spontaneous vaginal delivery)  Additional problems: none     Discharge diagnosis: Term Pregnancy Delivered                                                                                                Post partum procedures:none  Augmentation: AROM, Pitocin and Cytotec  Complications: None  Hospital course:  Induction of Labor With Vaginal Delivery   21 y.o. yo G1P1001 at [redacted]w[redacted]d was admitted to the hospital 02/27/2017 for induction of labor.  Indication for induction: Postdates and 2vc.  Patient had an uncomplicated labor course as follows: Membrane Rupture Time/Date: 4:50 PM ,02/27/2017   Intrapartum Procedures: Episiotomy: Median [2]                                         Lacerations:     Patient had delivery of a Postdates and 2vc infant.  Information for the patient's newborn:  Alaizah, Deruyter Boy Yensi [[id number]]  Delivery Method: Vag-Spont   02/28/2017  Details of delivery can be found in separate delivery note.  Patient had a routine postpartum course. Patient is discharged home 03/02/17.  Physical exam  Vitals:   02/28/17 1300 02/28/17 1714 03/01/17 1713 03/02/17 0622  BP: 110/62 109/71 112/71 109/67  Pulse: 80 80 79 70  Resp: 18 20 18 18   Temp: 98 F (36.7 C) 98.6 F (37 C) 98.5 F (36.9 C) 97.8 F (36.6 C)  TempSrc:   Oral Oral  SpO2:   100% 98%  Weight:      Height:       General: alert, cooperative and no distress Lochia: appropriate Uterine Fundus: firm Incision: N/A DVT Evaluation: No evidence of DVT seen on physical exam. Labs: Lab Results  Component Value Date   WBC 9.9 02/27/2017   HGB 10.7 (L) 02/27/2017   HCT 31.9 (L) 02/27/2017   MCV 78.8 02/27/2017   PLT 203 02/27/2017   CMP Latest Ref Rng & Units 07/05/2016  Glucose 65 - 99 mg/dL 80  BUN 6 - 20 mg/dL 8  Creatinine 7.00 - 1.74 mg/dL 9.44  Sodium 967 - 591 mmol/L 139  Potassium 3.5 - 5.2 mmol/L 4.2  Chloride 96 - 106 mmol/L 101  CO2 18 - 29 mmol/L 22  Calcium 8.7 - 10.2 mg/dL 9.2  Total Protein 6.0 - 8.5 g/dL 6.2  Total Bilirubin 0.0 - 1.2 mg/dL 0.3  Alkaline Phos 39 - 117 IU/L 46  AST 0 - 40 IU/L 36  ALT 0 - 32 IU/L 26    Discharge instruction: per After Visit Summary and "Baby and Me Booklet".  After visit meds:  Allergies as of 03/02/2017  Reactions   Codeine Nausea Only      Medication List    TAKE these medications   calcium carbonate 500 MG chewable tablet Commonly known as:  TUMS - dosed in mg elemental calcium Chew 2 tablets by mouth daily as needed for indigestion or heartburn.   ibuprofen 600 MG tablet Commonly known as:  ADVIL,MOTRIN Take 1 tablet (600 mg total) by mouth every 6 (six) hours as needed for moderate pain or cramping.   prenatal multivitamin Tabs tablet Take 1 tablet by mouth daily at 12 noon.       Diet: routine diet  Activity: Advance as tolerated. Pelvic rest for 6 weeks.   Outpatient follow up:6 weeks Follow up Appt:No future appointments. Follow up Visit:No Follow-up on file.  Postpartum contraception: Not Discussed  Newborn Data: Live born female  Birth Weight: 9 lb 1.2 oz (4116 g) APGAR: 6, 9  Baby Feeding: Breast Disposition:home with mother   03/02/2017 Cathrine Muster, DO

## 2017-03-02 NOTE — Discharge Instructions (Signed)
Nothing in vagina for 6 weeks.  No sex, tampons, and douching.  Other instructions as in [facility] Discharge Booklet. °

## 2017-03-02 NOTE — Lactation Note (Signed)
This note was copied from a baby's chart. Lactation Consultation Note  Patient Name: Kathryn Swanson [id number] Date: 03/02/2017   Parents did not call me to come for a lactation visit (Dad had put my number in his cell phone). I shared my concerns with Fannie Knee, RN on Mother-Baby & Chloe, RN in [place]. Fannie Knee, RN has assured me that parents will begin to increase supplementation volumes.   Lurline Hare [facility] 03/02/2017, 11:25 PM

## 2017-03-02 NOTE — Progress Notes (Signed)
Mom requested sitz bath.  Instructed on use, and used it.  Jtwells, rn

## 2017-03-02 NOTE — Lactation Note (Signed)
This note was copied from a baby's chart. Lactation Consultation Note  Patient Name: Kathryn Swanson [id number] Date: 03/02/2017 Reason for consult: Follow-up assessment   With this first time mom and term baby, now 64 hours old and at 9 % weight loss, and only 1 void since birth, and no voids since circumcision. NNP Boneta Lucks riddle aware, and will let Dr. Ave Filter know, and baby can not go home until it voids.  I reviewed basic breastfeeding teaching with the parents, benefit of ST, how to obtain a deep latch, hand expression. The baby has been cluster feeding, and parents can hear swallows at times. I assisted mom with cross cradle hold, and obtaining a deep latch. I observed good breast movement, and explained to parents this is evidence of a deep latch. I also had to tell mom how to keep baby's nose touching her breast, and not to compress her breast above baby's latch.  Parents aware baby will not go home until he voids.    Maternal Data    Feeding Feeding Type: Breast Fed Length of feed: 15 min (on and off sucking for 25 minutes, easy to stimulate a suck)  LATCH Score Latch: Grasps breast easily, tongue down, lips flanged, rhythmical sucking.  Audible Swallowing: A few with stimulation  Type of Nipple: Everted at rest and after stimulation  Comfort (Breast/Nipple): Filling, red/small blisters or bruises, mild/mod discomfort (intact but tender nipples, probably form shallow latches up to now)  Hold (Positioning): Assistance needed to correctly position infant at breast and maintain latch.  LATCH Score: 7  Interventions Interventions: Breast feeding basics reviewed;Assisted with latch;Skin to skin;Hand express;Adjust position;Support pillows;Position options;Expressed milk;Coconut oil  Lactation Tools Discussed/Used     Consult Status Consult Status: Follow-up Date: 03/03/17 Follow-up type: In-patient    Alfred Levins 03/02/2017, 10:50 AM

## 2017-03-02 NOTE — Lactation Note (Signed)
This note was copied from a baby's chart. Lactation Consultation Note  Patient Name: Kathryn Swanson [id number] Date: 03/02/2017  I spoke w/FOB out in the hall & explained that Dr Ave Filter had wanted me to pay a visit, but I understood from the nursing staff that Mom was tearful and feeling overwhelmed. FOB reports that infant was just fed 95ml of formula & that there were a couple of visitors in the room. I gave FOB my phone # and asked him to call me when Mom is ready.   Lurline Hare [facility] 03/02/2017, 7:40 PM

## 2017-03-03 ENCOUNTER — Ambulatory Visit: Payer: Self-pay

## 2017-03-03 NOTE — Lactation Note (Signed)
This note was copied from a baby's chart. Lactation Consultation Note  Patient Name: Kathryn Swanson Date: 03/03/2017  Mom states she has decided to bottle feed formula.  Mom seems sure about her decision.  We discussed options of allowing nipples to rest and pumping/bottlefeeding.  Instructed on breast care if she does not continue breastfeeding.  Offered any assist and reviewed outpatient services.  Mom has no questions at present and excited about taking baby home.   Maternal Data    Feeding Feeding Type: Bottle Fed - Formula  LATCH Score                   Interventions    Lactation Tools Discussed/Used     Consult Status      Adan Beal S 03/03/2017, 10:14 AM

## 2018-05-17 ENCOUNTER — Encounter (HOSPITAL_COMMUNITY): Payer: Self-pay | Admitting: Emergency Medicine

## 2018-05-17 ENCOUNTER — Emergency Department (HOSPITAL_COMMUNITY)
Admission: EM | Admit: 2018-05-17 | Discharge: 2018-05-17 | Disposition: A | Discharge status: Home / Self Care | Payer: No Typology Code available for payment source | Attending: Emergency Medicine | Admitting: Emergency Medicine

## 2018-05-17 ENCOUNTER — Emergency Department (HOSPITAL_COMMUNITY): Payer: No Typology Code available for payment source

## 2018-05-17 ENCOUNTER — Other Ambulatory Visit: Payer: Self-pay

## 2018-05-17 DIAGNOSIS — G43909 Migraine, unspecified, not intractable, without status migrainosus: Secondary | ICD-10-CM | POA: Diagnosis not present

## 2018-05-17 DIAGNOSIS — R51 Headache: Secondary | ICD-10-CM | POA: Diagnosis present

## 2018-05-17 DIAGNOSIS — Z79899 Other long term (current) drug therapy: Secondary | ICD-10-CM | POA: Insufficient documentation
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
## 2018-05-17 NOTE — Discharge Instructions (Addendum)
Please read attached information. If you experience any new or worsening signs or symptoms please return to the emergency room for evaluation. Please follow-up with your primary care provider or specialist as discussed.  °

## 2018-05-17 NOTE — ED Triage Notes (Signed)
Sent by urgent care for head CT, states she has had two migraines since Friday. Pt reports at onset of headaches she has loss of vision and numbness/"paralysis" in bilateral hands.

## 2018-05-17 NOTE — ED Notes (Signed)
ED Provider at bedside. 

## 2018-05-26 ENCOUNTER — Telehealth (INDEPENDENT_AMBULATORY_CARE_PROVIDER_SITE_OTHER): Payer: Self-pay

## 2018-05-26 ENCOUNTER — Emergency Department (HOSPITAL_COMMUNITY): Payer: No Typology Code available for payment source

## 2018-05-26 ENCOUNTER — Other Ambulatory Visit: Payer: Self-pay

## 2018-05-26 ENCOUNTER — Emergency Department (HOSPITAL_COMMUNITY)
Admission: EM | Admit: 2018-05-26 | Discharge: 2018-05-26 | Disposition: A | Discharge status: Home / Self Care | Payer: No Typology Code available for payment source | Attending: Emergency Medicine | Admitting: Emergency Medicine

## 2018-05-26 ENCOUNTER — Encounter (HOSPITAL_COMMUNITY): Payer: Self-pay | Admitting: *Deleted

## 2018-05-26 DIAGNOSIS — S92301A Fracture of unspecified metatarsal bone(s), right foot, initial encounter for closed fracture: Secondary | ICD-10-CM | POA: Insufficient documentation

## 2018-05-26 DIAGNOSIS — Y92411 Interstate highway as the place of occurrence of the external cause: Secondary | ICD-10-CM | POA: Insufficient documentation

## 2018-05-26 DIAGNOSIS — Y999 Unspecified external cause status: Secondary | ICD-10-CM | POA: Insufficient documentation

## 2018-05-26 DIAGNOSIS — S99921A Unspecified injury of right foot, initial encounter: Secondary | ICD-10-CM | POA: Diagnosis present

## 2018-05-26 DIAGNOSIS — Y9389 Activity, other specified: Secondary | ICD-10-CM | POA: Diagnosis not present

## 2018-05-26 DIAGNOSIS — Z79899 Other long term (current) drug therapy: Secondary | ICD-10-CM | POA: Diagnosis not present

## 2018-05-26 MED ORDER — ACETAMINOPHEN 500 MG PO TABS
1000.0000 mg | ORAL_TABLET | Freq: Once | ORAL | Status: AC
  Administered 2018-05-26: 1000 mg via ORAL
  Filled 2018-05-26: qty 2

## 2018-05-26 MED ORDER — OXYCODONE HCL 5 MG PO TABS: 5.0000 mg | ORAL_TABLET | ORAL | 0 refills | Status: AC | PRN

## 2018-05-26 MED ORDER — LORAZEPAM 0.5 MG PO TABS
0.5000 mg | ORAL_TABLET | Freq: Once | ORAL | Status: AC
  Administered 2018-05-26: 0.5 mg via ORAL
  Filled 2018-05-26: qty 1

## 2018-05-26 MED ORDER — OXYCODONE-ACETAMINOPHEN 5-325 MG PO TABS
1.0000 | ORAL_TABLET | Freq: Once | ORAL | Status: AC
  Administered 2018-05-26: 1 via ORAL
  Filled 2018-05-26: qty 1
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
## 2018-05-26 NOTE — ED Notes (Signed)
Ortho Consult at bedside

## 2018-05-26 NOTE — ED Notes (Signed)
Patient transported to X-ray 

## 2018-05-26 NOTE — Progress Notes (Addendum)
1:16pm-CSW spoke with PA Debarah Crapelaudia and was informed that CSW consult is no longer needed after speaking with family. CSW expressed understanding and will sign off at this time. If new need arises please reconsult.  CSW acknowledges consult for further resources at this time. CSW spoke with RN concerning resources and was informed that pt may benefit from Outpatient  Mental Health . CSW advised that pt is heading to CT at this time. CSW will follow up with pt once out of CT.     Kathryn MangesKierra S. Kathryn Swanson, MSW, LCSW-A Emergency Department Clinical Social Worker [phone]

## 2018-05-26 NOTE — ED Notes (Signed)
Ortho tech at the bedside.  

## 2018-05-26 NOTE — Telephone Encounter (Signed)
I tried to reach the pt again and it goes straight to voicemail. I left another message to call office so that we can put her in the sch tomorrow.
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
## 2018-05-26 NOTE — ED Notes (Signed)
Patient transported to CT 
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
## 2018-05-26 NOTE — ED Notes (Signed)
Pt noted to be shivering, warm blankets applied.  Drink offered with medication.  Pt updated on POC including xrays

## 2018-05-26 NOTE — Progress Notes (Signed)
Orthopedic Tech Progress Note Patient Details:  Kathryn GandyCourtney Swanson 14-Mar-1996 [id number]  Ortho Devices Type of Ortho Device: Crutches, Post (short leg) splint Ortho Device/Splint Location: rle Ortho Device/Splint Interventions: Ordered, Application, Adjustment   Post Interventions Patient Tolerated: Well Instructions Provided: Care of device, Adjustment of device   Trinna PostMartinez, [place] Spainhower J 05/26/2018, 3:14 PM

## 2018-05-26 NOTE — ED Notes (Signed)
iuce pack applied to right ankle.

## 2018-05-26 NOTE — ED Notes (Signed)
Pt mother requesting pain medication for the pt. RN and PA made aware

## 2018-05-26 NOTE — ED Notes (Signed)
Ortho Tech notified and will present to the bedside.

## 2018-05-27 ENCOUNTER — Encounter (INDEPENDENT_AMBULATORY_CARE_PROVIDER_SITE_OTHER): Payer: Self-pay | Admitting: Orthopedic Surgery

## 2018-05-27 ENCOUNTER — Ambulatory Visit (INDEPENDENT_AMBULATORY_CARE_PROVIDER_SITE_OTHER): Payer: PRIVATE HEALTH INSURANCE | Admitting: Orthopedic Surgery

## 2018-05-27 VITALS — Ht 67.0 in | Wt 135.0 lb

## 2018-05-27 DIAGNOSIS — S92301A Fracture of unspecified metatarsal bone(s), right foot, initial encounter for closed fracture: Secondary | ICD-10-CM | POA: Diagnosis not present

## 2018-05-27 DIAGNOSIS — S92214A Nondisplaced fracture of cuboid bone of right foot, initial encounter for closed fracture: Secondary | ICD-10-CM
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
## 2018-05-27 NOTE — Telephone Encounter (Signed)
Have left several messages for this pt to come in today for eval. Will hold message and continue to reach out for appt.

## 2018-06-01 ENCOUNTER — Telehealth (INDEPENDENT_AMBULATORY_CARE_PROVIDER_SITE_OTHER): Payer: Self-pay | Admitting: Orthopedic Surgery

## 2018-06-01 NOTE — Telephone Encounter (Signed)
05/27/18 ov note faxed [facility], appt 11/20 fax [phone]. Patient signed authorization

## 2018-06-17 ENCOUNTER — Ambulatory Visit (INDEPENDENT_AMBULATORY_CARE_PROVIDER_SITE_OTHER): Payer: PRIVATE HEALTH INSURANCE | Admitting: Orthopedic Surgery

## 2019-10-21 IMAGING — CT CT HEAD W/O CM
4 series · 15 of 47 positions shown, 17 images · non-contrast
Comparison: None.

CLINICAL DATA: 22-year-old female with acute severe headache today.

EXAM:
CT HEAD WITHOUT CONTRAST
TECHNIQUE: Contiguous axial images were obtained from the base of the skull
through the vertex without intravenous contrast.

[Series 3: head wo · axial · 0.39mm/px · z∈[-82,+38]mm · 7 of 34 slices shown, 9 images]
[im 5/34  brain]
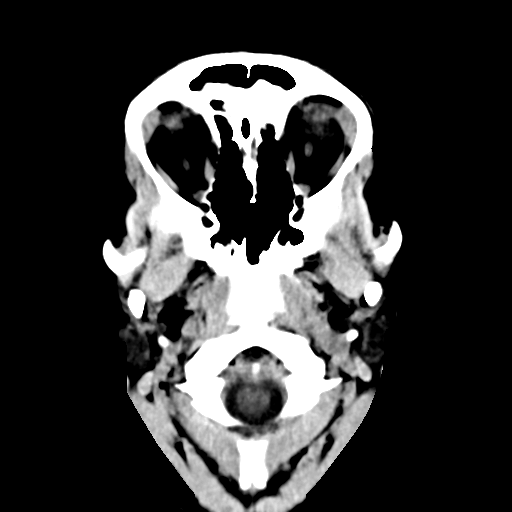
[im 5/34  bone]
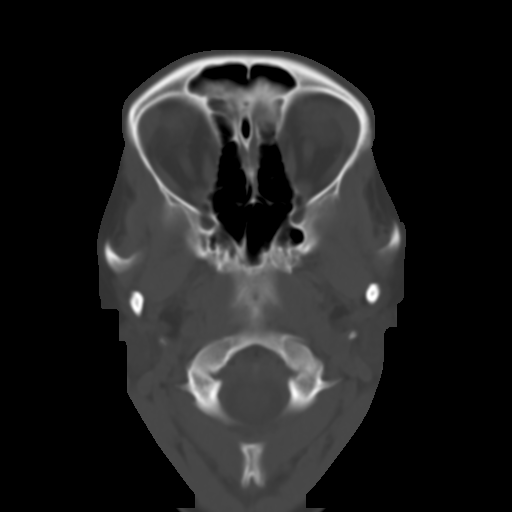
[im 9/34  brain]
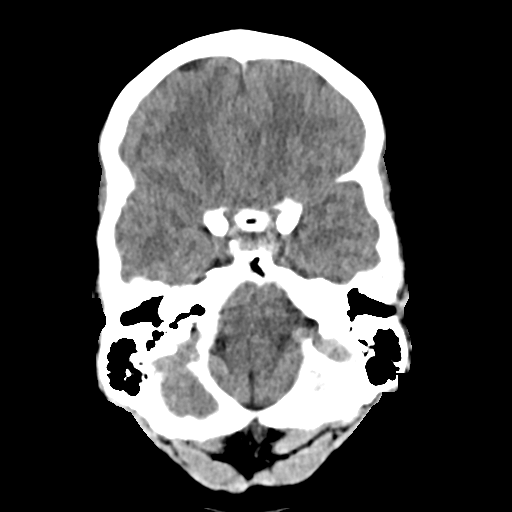
[im 13/34  brain]
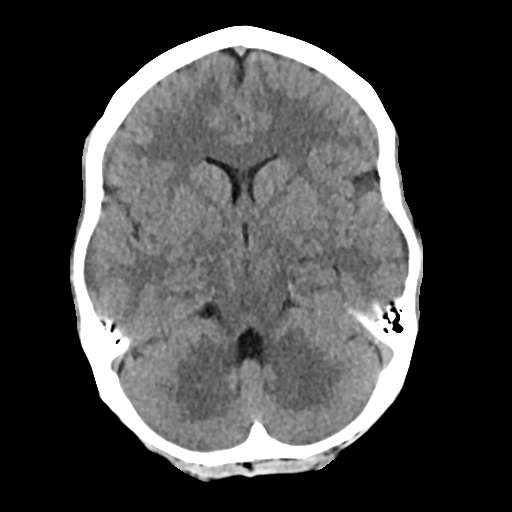
[im 17/34  brain]
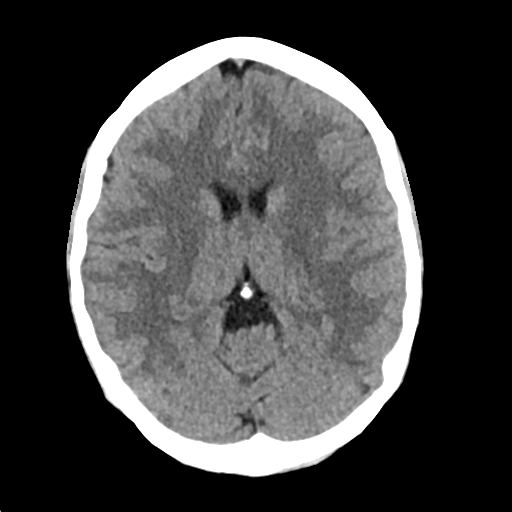
[im 21/34  brain]
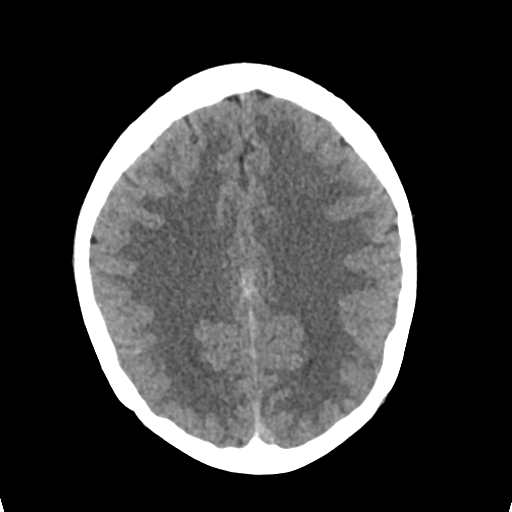
[im 21/34  bone]
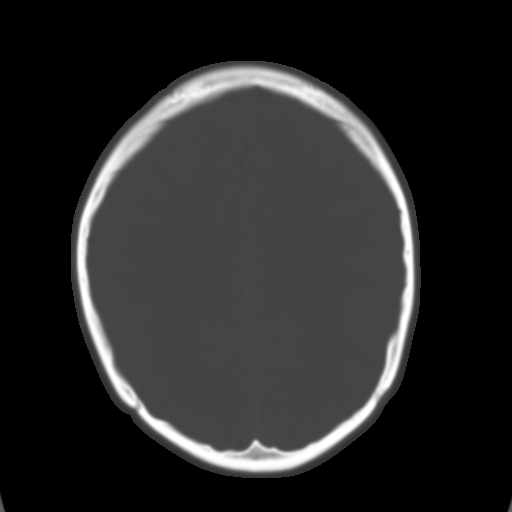
[im 25/34  brain]
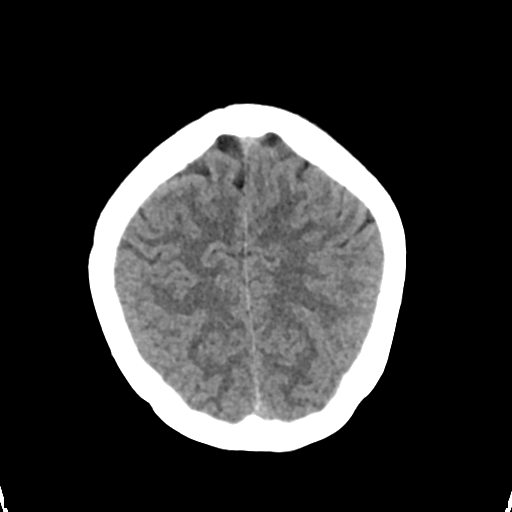
[im 29/34  brain]
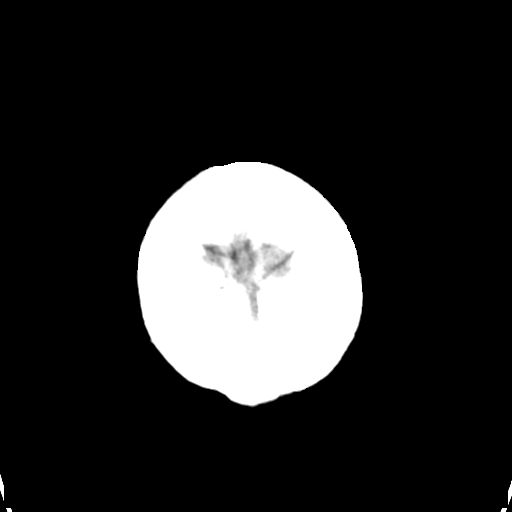

[Series 4: cor soft · coronal · 0.38mm/px · 3 of 67 slices shown]
[im 23/67  brain]
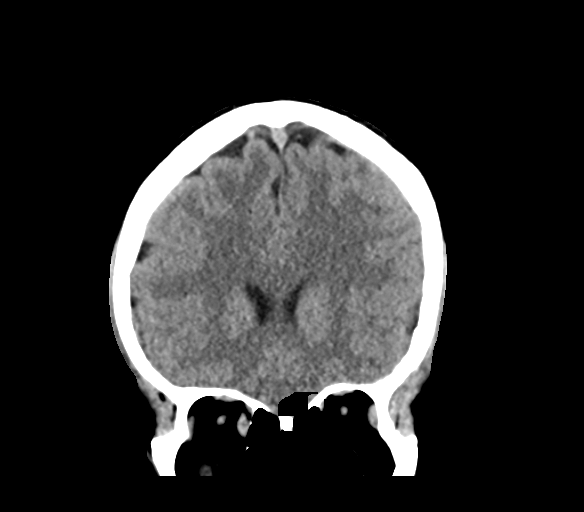
[im 30/67  brain]
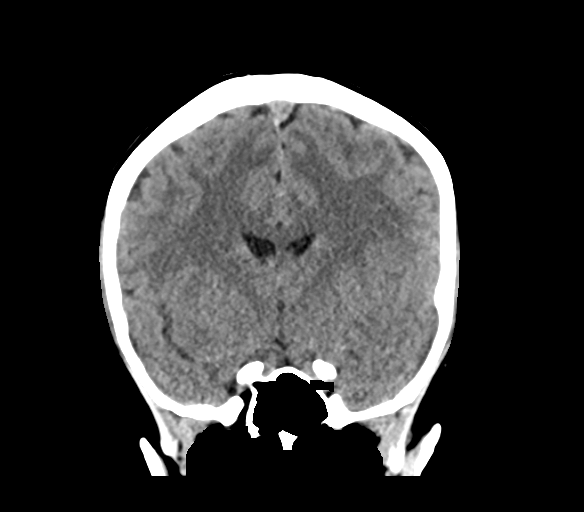
[im 37/67  brain]
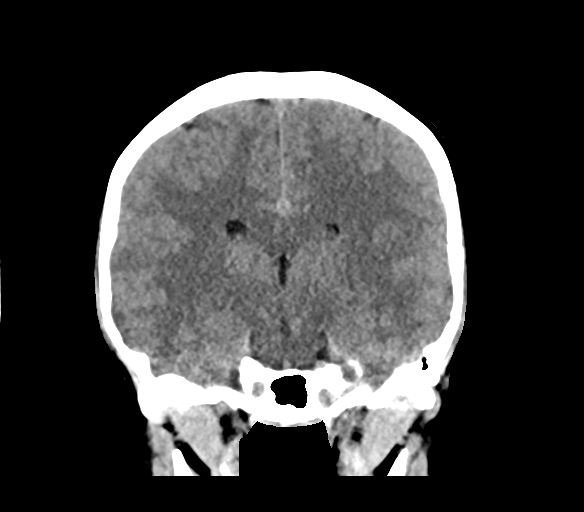

[Series 5: sag soft · sagittal · 0.32mm/px · 3 of 67 slices shown]
[im 23/67  brain]
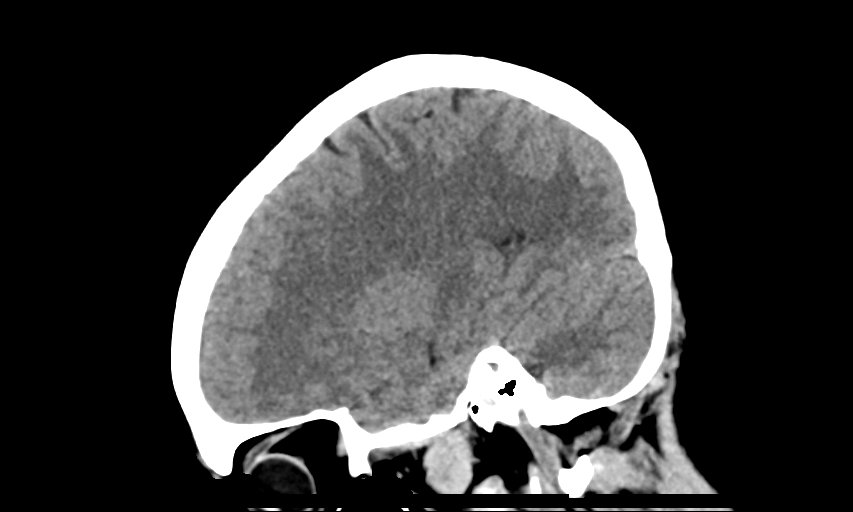
[im 34/67  brain]
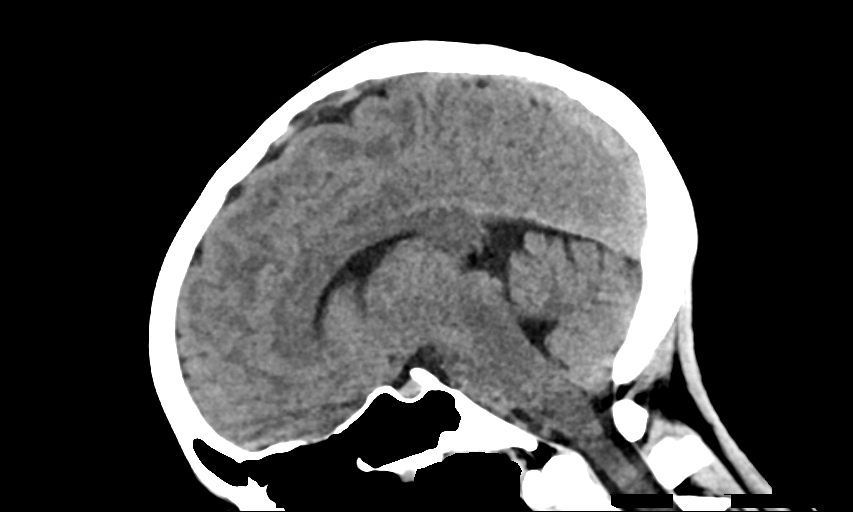
[im 45/67  brain]
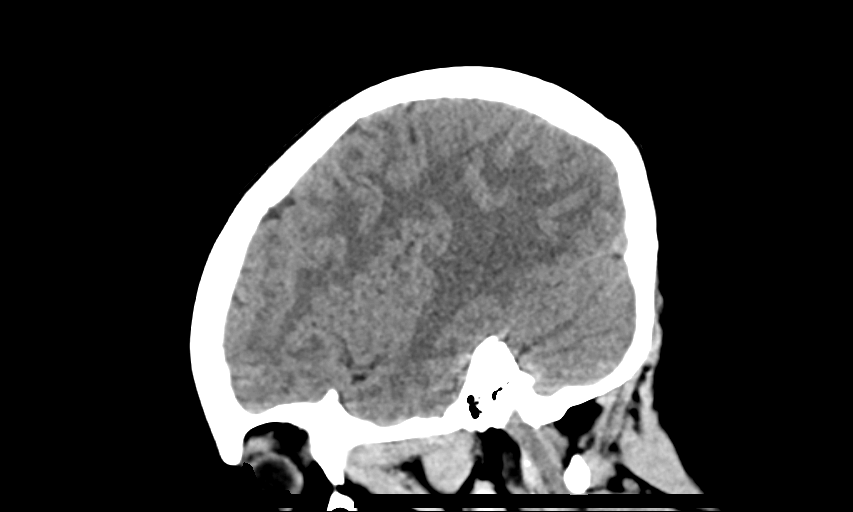

[Series 6: head bone · axial · 0.39mm/px · z∈[-86,-70]mm · 2 of 83 slices shown]
[im 9/83  bone]
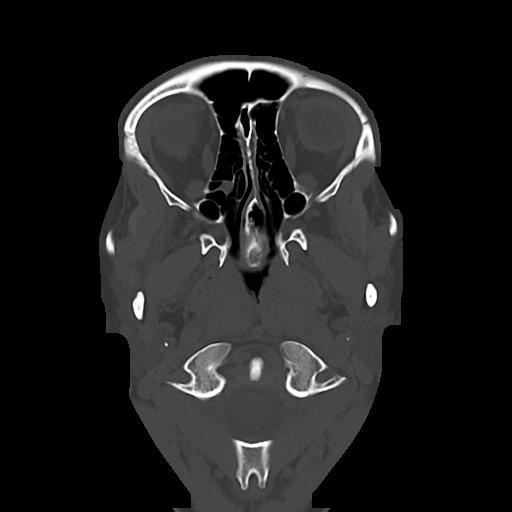
[im 17/83  bone]
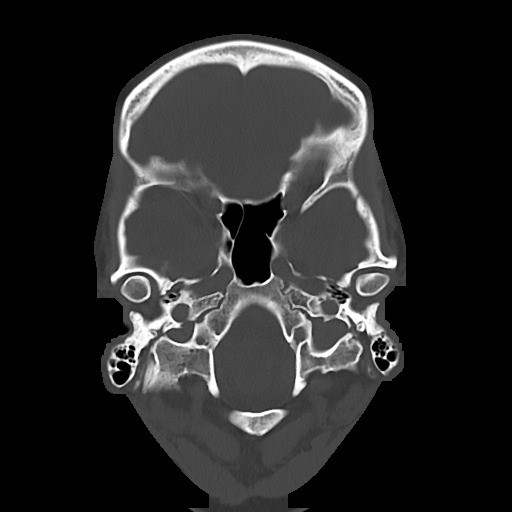

[15 of 47 positions shown; findings below may reference images not displayed]

FINDINGS: Brain: No evidence of acute infarction, hemorrhage, hydrocephalus,
extra-axial collection or mass lesion/mass effect.

Vascular: No hyperdense vessel or unexpected calcification.

Skull: Normal. Negative for fracture or focal lesion.

Sinuses/Orbits: No acute finding.

Other: None.
IMPRESSION: Unremarkable noncontrast head CT.
